# Patient Record
Sex: Male | Born: 2000
Health system: Southern US, Community
[De-identification: ages and names within clinical notes are randomized; demographics above are authoritative.]

---
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
## 2020-03-26 DIAGNOSIS — Z20822 Contact with and (suspected) exposure to covid-19: Secondary | ICD-10-CM | POA: Diagnosis not present

## 2022-01-24 ENCOUNTER — Emergency Department (HOSPITAL_COMMUNITY)
Admission: EM | Admit: 2022-01-24 | Discharge: 2022-01-24 | Disposition: A | Discharge status: Home / Self Care | Payer: No Typology Code available for payment source | Attending: Emergency Medicine | Admitting: Emergency Medicine

## 2022-01-24 ENCOUNTER — Emergency Department (HOSPITAL_COMMUNITY): Payer: No Typology Code available for payment source

## 2022-01-24 ENCOUNTER — Other Ambulatory Visit: Payer: Self-pay

## 2022-01-24 ENCOUNTER — Encounter (HOSPITAL_COMMUNITY): Payer: Self-pay

## 2022-01-24 DIAGNOSIS — F17200 Nicotine dependence, unspecified, uncomplicated: Secondary | ICD-10-CM | POA: Diagnosis not present

## 2022-01-24 DIAGNOSIS — W540XXA Bitten by dog, initial encounter: Secondary | ICD-10-CM | POA: Insufficient documentation

## 2022-01-24 DIAGNOSIS — S81851A Open bite, right lower leg, initial encounter: Secondary | ICD-10-CM | POA: Insufficient documentation

## 2022-01-24 DIAGNOSIS — S81852A Open bite, left lower leg, initial encounter: Secondary | ICD-10-CM | POA: Diagnosis not present

## 2022-01-24 DIAGNOSIS — S21152A Open bite of left front wall of thorax without penetration into thoracic cavity, initial encounter: Secondary | ICD-10-CM | POA: Insufficient documentation

## 2022-01-24 DIAGNOSIS — S8991XA Unspecified injury of right lower leg, initial encounter: Secondary | ICD-10-CM | POA: Diagnosis present

## 2022-01-24 DIAGNOSIS — Z23 Encounter for immunization: Secondary | ICD-10-CM | POA: Diagnosis not present

## 2022-01-24 DIAGNOSIS — R69 Illness, unspecified: Secondary | ICD-10-CM | POA: Diagnosis not present

## 2022-01-24 MED ORDER — TETANUS-DIPHTH-ACELL PERTUSSIS 5-2.5-18.5 LF-MCG/0.5 IM SUSY
0.5000 mL | PREFILLED_SYRINGE | Freq: Once | INTRAMUSCULAR | Status: AC
  Administered 2022-01-24: 0.5 mL via INTRAMUSCULAR
  Filled 2022-01-24: qty 0.5

## 2022-01-24 MED ORDER — HYDROCODONE-ACETAMINOPHEN 5-325 MG PO TABS: 1.0000 | ORAL_TABLET | ORAL | 0 refills | Status: AC | PRN

## 2022-01-24 MED ORDER — AMOXICILLIN-POT CLAVULANATE 875-125 MG PO TABS: 1.0000 | ORAL_TABLET | Freq: Two times a day (BID) | ORAL | 0 refills | Status: AC

## 2022-01-24 MED ORDER — AMOXICILLIN-POT CLAVULANATE 875-125 MG PO TABS
1.0000 | ORAL_TABLET | Freq: Once | ORAL | Status: AC
  Administered 2022-01-24: 1 via ORAL
  Filled 2022-01-24: qty 1

## 2022-01-24 MED ORDER — HYDROCODONE-ACETAMINOPHEN 5-325 MG PO TABS
1.0000 | ORAL_TABLET | Freq: Once | ORAL | Status: AC
  Administered 2022-01-24: 1 via ORAL
  Filled 2022-01-24: qty 1

## 2022-01-24 NOTE — ED Provider Notes (Signed)
[facility] DEPT Provider Note   CSN: [id number] Arrival date & time: 01/24/22  0026     History  Chief Complaint  Patient presents with   Animal Bite    Vanessa Alling is a 21 y.o. male.  Patient as above with significant medical history as below, including no sig medical history who presents to the ED with complaint of dog bite. Around 1-2 hours pta pt was bitten by a neighbors pitt bull dog. Neighbor reported the dog was UTD on immunizations. Pt spoke with PD, animal control unable to assess the dog until this morning during business hours. Pt with bite to his right calf and to left chest wall. Spouse did clean the area with peroxide PTA. Pt is unsure of last tetanus shot. He has been ambulatory but having pain to right calf 2/2 bite. No numbness or tingling. No dib. No falls. No other injuries reported.      History reviewed. No pertinent past medical history.  History reviewed. No pertinent surgical history.   The history is provided by the patient and the spouse. No language interpreter was used.  Animal Bite Associated symptoms: no fever and no rash        Home Medications Prior to Admission medications   Medication Sig Start Date End Date Taking? Authorizing Provider  ibuprofen (ADVIL,MOTRIN) 800 MG tablet Take 1 tablet (800 mg total) by mouth 3 (three) times daily. 09/23/17   Charlesetta Shanks, MD  ondansetron (ZOFRAN ODT) 4 MG disintegrating tablet Take 1 tablet (4 mg total) by mouth every 4 (four) hours as needed for nausea or vomiting. 09/23/17   Charlesetta Shanks, MD      Allergies    Patient has no known allergies.    Review of Systems   Review of Systems  Constitutional:  Negative for chills and fever.  HENT:  Negative for facial swelling and trouble swallowing.   Eyes:  Negative for photophobia and visual disturbance.  Respiratory:  Negative for cough and shortness of breath.   Cardiovascular:  Negative for chest pain and  palpitations.  Gastrointestinal:  Negative for abdominal pain, nausea and vomiting.  Endocrine: Negative for polydipsia and polyuria.  Genitourinary:  Negative for difficulty urinating and hematuria.  Musculoskeletal:  Negative for gait problem and joint swelling.  Skin:  Positive for wound. Negative for pallor and rash.  Neurological:  Negative for syncope and headaches.  Psychiatric/Behavioral:  Negative for agitation and confusion.     Physical Exam Updated Vital Signs BP (!) 144/121 (BP Location: Left Arm)   Pulse 96   Temp 98.3 F (36.8 C) (Oral)   Resp 17   Ht 5\' 8"  (1.727 m)   Wt 97.5 kg   SpO2 100%   BMI 32.69 kg/m  Physical Exam Vitals and nursing note reviewed.  Constitutional:      General: He is not in acute distress.    Appearance: Normal appearance. He is well-developed. He is not ill-appearing.  HENT:     Head: Normocephalic and atraumatic. No right periorbital erythema or left periorbital erythema.     Jaw: There is normal jaw occlusion. No trismus.     Right Ear: External ear normal.     Left Ear: External ear normal.     Mouth/Throat:     Mouth: Mucous membranes are moist.  Eyes:     General: No scleral icterus. Cardiovascular:     Rate and Rhythm: Normal rate and regular rhythm.     Pulses: Normal  pulses.     Heart sounds: Normal heart sounds.  Pulmonary:     Effort: Pulmonary effort is normal. No respiratory distress.     Breath sounds: Normal breath sounds.  Abdominal:     General: Abdomen is flat.     Palpations: Abdomen is soft.     Tenderness: There is no abdominal tenderness.  Musculoskeletal:        General: Normal range of motion.       Arms:     Cervical back: Normal range of motion.     Right lower leg: No edema.     Left lower leg: No edema.       Legs:  Skin:    General: Skin is warm and dry.     Capillary Refill: Capillary refill takes less than 2 seconds.  Neurological:     Mental Status: He is alert and oriented to person,  place, and time.     GCS: GCS eye subscore is 4. GCS verbal subscore is 5. GCS motor subscore is 6.  Psychiatric:        Mood and Affect: Mood normal.        Behavior: Behavior normal.     ED Results / Procedures / Treatments   Labs (all labs ordered are listed, but only abnormal results are displayed) Labs Reviewed - No data to display  EKG None  Radiology DG Tibia/Fibula Right  Result Date: 01/24/2022 CLINICAL DATA:  Recent dog bite with soft tissue injury, initial encounter EXAM: RIGHT TIBIA AND FIBULA - 2 VIEW COMPARISON:  None Available. FINDINGS: No acute fracture or dislocation is noted. No radiopaque foreign body is noted. Air is noted in the lateral soft tissues near the knee joint consistent with the recent injury. IMPRESSION: Soft tissue injury without acute bony abnormality. Electronically Signed   By: Alcide Clever M.D.   On: 01/24/2022 03:09    Procedures Procedures    Medications Ordered in ED Medications  Tdap (BOOSTRIX) injection 0.5 mL (0.5 mLs Intramuscular Given 01/24/22 0311)  HYDROcodone-acetaminophen (NORCO/VICODIN) 5-325 MG per tablet 1 tablet (1 tablet Oral Given 01/24/22 0311)  amoxicillin-clavulanate (AUGMENTIN) 875-125 MG per tablet 1 tablet (1 tablet Oral Given 01/24/22 2297)    ED Course/ Medical Decision Making/ A&P                           Medical Decision Making Amount and/or Complexity of Data Reviewed Radiology: ordered.  Risk Prescription drug management.    CC: Dog bite  This patient presents to the Emergency Department for the above complaint. This involves an extensive number of treatment options and is a complaint that carries with it a high risk of complications and morbidity. Vital signs were reviewed. Serious etiologies considered.  Record review:  Previous records obtained and reviewed prior ED visits, prior labs and imaging  Additional history obtained from family at bedside  Medical and surgical history as noted above.    Work up as above, notable for:  Labs & imaging results that were available during my care of the patient were visualized by me and considered in my medical decision making.  Physical exam as above.   I ordered imaging studies which included right leg x-ray. I visualized the imaging, interpreted images, and I agree with radiologist interpretation.  No foreign body, no fracture.  Management: Analgesics, wound care  Wounds are not large or gaping.  Plan for closure by secondary intention.  ED Course:  Reassessment:  Patient reports improvement to his pain following analgesics.  Wound dressed appropriately.  Patient with dog bite.  We will start patient on Augmentin.  Tetanus updated.  Discussed rabies prophylaxis, he was told that the animal was up-to-date on immunizations by the dog owner.  Animal control is going to investigate this morning regarding the patient's vaccine/rabies status.  Patient opts to wait until animal control is able to assess the animals rabies status before he decides on getting tetanus prophylaxis.  Recommend the patient return to the emergency department if he would like to begin rabies series or if animal control determines that the animal is at risk for rabies.   Admission was considered.    Ambulatory with steady gait.  Stable for discharge.   The patient improved significantly and was discharged in stable condition. Detailed discussions were had with the patient regarding current findings, and need for close f/u with PCP or on call doctor. The patient has been instructed to return immediately if the symptoms worsen in any way for re-evaluation. Patient verbalized understanding and is in agreement with current care plan. All questions answered prior to discharge.           Social determinants of health include -  Social History   Socioeconomic History   Marital status: Single    Spouse name: Not on file   Number of children: Not on file    Years of education: Not on file   Highest education level: Not on file  Occupational History   Not on file  Tobacco Use   Smoking status: Every Day   Smokeless tobacco: Never  Substance and Sexual Activity   Alcohol use: No   Drug use: Yes    Types: Marijuana   Sexual activity: Not on file  Other Topics Concern   Not on file  Social History Narrative   Not on file   Social Determinants of Health   Financial Resource Strain: Not on file  Food Insecurity: Not on file  Transportation Needs: Not on file  Physical Activity: Not on file  Stress: Not on file  Social Connections: Not on file  Intimate Partner Violence: Not on file      This chart was dictated using voice recognition software.  Despite best efforts to proofread,  errors can occur which can change the documentation meaning.         Final Clinical Impression(s) / ED Diagnoses Final diagnoses:  Dog bite, initial encounter    Rx / DC Orders ED Discharge Orders     None         Sloan Leiter, DO 01/24/22 5427

## 2022-01-24 NOTE — ED Notes (Signed)
Discharge instructions were discussed with pt. Pt verbalized understanding with no questions at this time. Pt to go home with s/o at bedside 

## 2022-01-24 NOTE — ED Triage Notes (Signed)
Pt presents to ED from home, pt states he was bit on the right calf and left side of chest tonight by a pit bull. Owners state dog is up to date on immunizations.

## 2022-01-24 NOTE — ED Notes (Signed)
Pt wound to left posterior leg was irrigated and dressed with xeroform and culex per ED order

## 2022-01-24 NOTE — Discharge Instructions (Addendum)
It was a pleasure caring for you today in the emergency department.  Please return to the emergency department for any worsening or worrisome symptoms.  Please follow-up with animal control regarding the rabies status of the dog that bit you.

## 2022-01-28 DIAGNOSIS — Z7689 Persons encountering health services in other specified circumstances: Secondary | ICD-10-CM | POA: Diagnosis not present

## 2022-01-28 DIAGNOSIS — Z0000 Encounter for general adult medical examination without abnormal findings: Secondary | ICD-10-CM | POA: Diagnosis not present

## 2023-10-03 DIAGNOSIS — Z6841 Body Mass Index (BMI) 40.0 and over, adult: Secondary | ICD-10-CM | POA: Diagnosis not present

## 2023-10-03 DIAGNOSIS — Z024 Encounter for examination for driving license: Secondary | ICD-10-CM | POA: Diagnosis not present

## 2023-10-03 DIAGNOSIS — G4730 Sleep apnea, unspecified: Secondary | ICD-10-CM | POA: Diagnosis not present

## 2023-10-14 DIAGNOSIS — G4730 Sleep apnea, unspecified: Secondary | ICD-10-CM | POA: Diagnosis not present

## 2023-10-14 DIAGNOSIS — Z6841 Body Mass Index (BMI) 40.0 and over, adult: Secondary | ICD-10-CM | POA: Diagnosis not present

## 2023-10-14 DIAGNOSIS — Z024 Encounter for examination for driving license: Secondary | ICD-10-CM | POA: Diagnosis not present
